# Patient Record
Sex: Female | Born: 1955 | Race: White | Hispanic: No | Marital: Married | Smoking: Current every day smoker
Health system: Southern US, Community
[De-identification: ages and names within clinical notes are randomized; demographics above are authoritative.]

## PROBLEM LIST (undated history)

## (undated) HISTORY — PX: HERNIA REPAIR: SHX51

## (undated) HISTORY — PX: ABDOMINAL HYSTERECTOMY: SHX81

---

## 2009-04-08 ENCOUNTER — Emergency Department (HOSPITAL_COMMUNITY): Admission: EM | Admit: 2009-04-08 | Discharge: 2009-04-08 | Payer: Self-pay | Admitting: Emergency Medicine

## 2015-08-05 ENCOUNTER — Emergency Department (HOSPITAL_COMMUNITY): Payer: Self-pay

## 2015-08-05 ENCOUNTER — Encounter (HOSPITAL_COMMUNITY): Payer: Self-pay | Admitting: *Deleted

## 2015-08-05 ENCOUNTER — Emergency Department (HOSPITAL_COMMUNITY)
Admission: EM | Admit: 2015-08-05 | Discharge: 2015-08-05 | Disposition: A | Discharge status: Home / Self Care | Payer: Self-pay | Attending: Emergency Medicine | Admitting: Emergency Medicine

## 2015-08-05 DIAGNOSIS — Z87828 Personal history of other (healed) physical injury and trauma: Secondary | ICD-10-CM | POA: Insufficient documentation

## 2015-08-05 DIAGNOSIS — M549 Dorsalgia, unspecified: Secondary | ICD-10-CM | POA: Insufficient documentation

## 2015-08-05 DIAGNOSIS — J449 Chronic obstructive pulmonary disease, unspecified: Secondary | ICD-10-CM

## 2015-08-05 DIAGNOSIS — J441 Chronic obstructive pulmonary disease with (acute) exacerbation: Secondary | ICD-10-CM | POA: Insufficient documentation

## 2015-08-05 DIAGNOSIS — F17200 Nicotine dependence, unspecified, uncomplicated: Secondary | ICD-10-CM | POA: Insufficient documentation

## 2015-08-05 DIAGNOSIS — Z720 Tobacco use: Secondary | ICD-10-CM

## 2015-08-05 MED ORDER — PREDNISONE 50 MG PO TABS
60.0000 mg | ORAL_TABLET | Freq: Once | ORAL | Status: AC
  Administered 2015-08-05: 60 mg via ORAL
  Filled 2015-08-05: qty 1

## 2015-08-05 MED ORDER — PREDNISONE 20 MG PO TABS: 20.0000 mg | ORAL_TABLET | Freq: Two times a day (BID) | ORAL | Status: AC

## 2015-08-05 MED ORDER — ALBUTEROL SULFATE HFA 108 (90 BASE) MCG/ACT IN AERS: 1.0000 | INHALATION_SPRAY | Freq: Four times a day (QID) | RESPIRATORY_TRACT | Status: AC | PRN

## 2015-08-05 NOTE — ED Notes (Signed)
Pt has multiple complaints; pt is c/o rib pain, sore throat, bilateral knee pain and states she has vomited x 1

## 2015-08-05 NOTE — ED Provider Notes (Signed)
CSN: [id number]     Arrival date & time 08/05/15  0439 History   First MD Initiated Contact with Patient 08/05/15 [phone]     Chief Complaint  Patient presents with  . Chest Pain      HPI  History presents for evaluation of a cough. States she's had a sore throat from coughing over the last several days. His initially states she vomited once and then states she "almost it". States she coughs until sometime she "almost vomits. Mentions as an afterthought that she fell a year ago and injured her back and her knee and "never got them looked at". Cause of most daily. 2 pack per day smoker since she was age 60.  History reviewed. No pertinent past medical history. Past Surgical History  Procedure Laterality Date  . Abdominal hysterectomy    . Hernia repair     History reviewed. No pertinent family history. Social History  Substance Use Topics  . Smoking status: Current Every Day Smoker -- 0.50 packs/day  . Smokeless tobacco: None  . Alcohol Use: No   OB History    No data available     Review of Systems  Constitutional: Negative for fever, chills, diaphoresis, appetite change and fatigue.  HENT: Negative for mouth sores, sore throat and trouble swallowing.   Eyes: Negative for visual disturbance.  Respiratory: Positive for cough, shortness of breath and wheezing. Negative for chest tightness.   Cardiovascular: Negative for chest pain.  Gastrointestinal: Negative for nausea, vomiting, abdominal pain, diarrhea and abdominal distention.  Endocrine: Negative for polydipsia, polyphagia and polyuria.  Genitourinary: Negative for dysuria, frequency and hematuria.  Musculoskeletal: Positive for back pain and arthralgias. Negative for gait problem.  Skin: Negative for color change, pallor and rash.  Neurological: Negative for dizziness, syncope, light-headedness and headaches.  Hematological: Does not bruise/bleed easily.  Psychiatric/Behavioral: Negative for behavioral problems and  confusion.      Allergies  Ibuprofen  Home Medications   Prior to Admission medications   Medication Sig Start Date End Date Taking? Authorizing Provider  albuterol (PROVENTIL HFA;VENTOLIN HFA) 108 (90 Base) MCG/ACT inhaler Inhale 1-2 puffs into the lungs every 6 (six) hours as needed for wheezing. 08/05/15   Rolland PorterMark Susano Cleckler, MD  predniSONE (DELTASONE) 20 MG tablet Take 1 tablet (20 mg total) by mouth 2 (two) times daily with a meal. 08/05/15   Rolland PorterMark Kortland Nichols, MD   BP 125/84 mmHg  Pulse 69  Temp(Src) 97.7 F (36.5 C) (Oral)  Resp 18  Ht 5\' 3"  (1.6 m)  Wt 115 lb (52.164 kg)  BMI 20.38 kg/m2  SpO2 96% Physical Exam  Constitutional: She is oriented to person, place, and time. She appears well-developed and well-nourished. No distress.  HENT:  Head: Normocephalic.  Eyes: Conjunctivae are normal. Pupils are equal, round, and reactive to light. No scleral icterus.  Neck: Normal range of motion. Neck supple. No thyromegaly present.  Cardiovascular: Normal rate and regular rhythm.  Exam reveals no gallop and no friction rub.   No murmur heard. Pulmonary/Chest: Effort normal and breath sounds normal. No respiratory distress. She has no wheezes. She has no rales.  Mild end expiratory wheeze. No increased work of breathing retractions, rhonchi, or rales. Well oxygenated.  Abdominal: Soft. Bowel sounds are normal. She exhibits no distension. There is no tenderness. There is no rebound.  Musculoskeletal: Normal range of motion.  Normal exam to left knee. No effusion. Full range of motion. Does not have an antalgic gait.  Neurological: She is alert  and oriented to person, place, and time.  Skin: Skin is warm and dry. No rash noted.  Psychiatric: She has a normal mood and affect. Her behavior is normal.    ED Course  Procedures (including critical care time) Labs Review Labs Reviewed - No data to display  Imaging Review Dg Chest 2 View  08/05/2015  CLINICAL DATA:  Acute onset of generalized chest  pain and back pain. Initial encounter. EXAM: CHEST  2 VIEW COMPARISON:  None. FINDINGS: The lungs are well-aerated. Peribronchial thickening is noted. There is no evidence of pleural effusion or pneumothorax. The heart is normal in size; the mediastinal contour is within normal limits. No acute osseous abnormalities are seen. IMPRESSION: Peribronchial thickening noted.  Lungs otherwise clear. Electronically Signed   By: Roanna Raider M.D.   On: 08/05/2015 06:12   I have personally reviewed and evaluated these images and lab results as part of my medical decision-making.   EKG Interpretation None      MDM   Final diagnoses:  Chronic obstructive pulmonary disease, unspecified COPD type (HCC)  Tobacco abuse   No acute abnormalities on chest x-ray. Likely some emphysema with her 50+-pack-year history. Not hypoxemic or febrile. Plan is treatment for exacerbation. Encouraging stop smoking.  Of note patient was rather adamant about having her knee and back x-ray because she "fell last year". States her injury was over a year ago. She has no neurological symptoms or findings. She is ambulatory without limp on her knee. Has no obvious effusion or asymmetry to her knee left versus right. No indication for acute imaging.    Rolland Porter, MD 08/05/15 [phone]

## 2015-08-05 NOTE — ED Notes (Signed)
Patient verbalizes understanding of discharge instructions, prescription medications, home care and follow up care. Patient ambulatory out of department at this time. 

## 2015-08-05 NOTE — ED Notes (Signed)
Patient refusing chest X-ray at this time, until "I get my knees and my back X-rayed" per patient. EDP notified

## 2015-08-05 NOTE — Discharge Instructions (Signed)
Stop smoking. Stop smoking. Stop smoking.  How to Use an Inhaler Using your inhaler correctly is very important. Good technique will make sure that the medicine reaches your lungs.  HOW TO USE AN INHALER:  Take the cap off the inhaler.  If this is the first time using your inhaler, you need to prime it. Shake the inhaler for 5 seconds. Release four puffs into the air, away from your face. Ask your doctor for help if you have questions.  Shake the inhaler for 5 seconds.  Turn the inhaler so the bottle is above the mouthpiece.  Put your pointer finger on top of the bottle. Your thumb holds the bottom of the inhaler.  Open your mouth.  Either hold the inhaler away from your mouth (the width of 2 fingers) or place your lips tightly around the mouthpiece. Ask your doctor which way to use your inhaler.  Breathe out as much air as possible.  Breathe in and push down on the bottle 1 time to release the medicine. You will feel the medicine go in your mouth and throat.  Continue to take a deep breath in very slowly. Try to fill your lungs.  After you have breathed in completely, hold your breath for 10 seconds. This will help the medicine to settle in your lungs. If you cannot hold your breath for 10 seconds, hold it for as long as you can before you breathe out.  Breathe out slowly, through pursed lips. Whistling is an example of pursed lips.  If your doctor has told you to take more than 1 puff, wait at least 15-30 seconds between puffs. This will help you get the best results from your medicine. Do not use the inhaler more than your doctor tells you to.  Put the cap back on the inhaler.  Follow the directions from your doctor or from the inhaler package about cleaning the inhaler. If you use more than one inhaler, ask your doctor which inhalers to use and what order to use them in. Ask your doctor to help you figure out when you will need to refill your inhaler.  If you use a steroid  inhaler, always rinse your mouth with water after your last puff, gargle and spit out the water. Do not swallow the water. GET HELP IF:  The inhaler medicine only partially helps to stop wheezing or shortness of breath.  You are having trouble using your inhaler.  You have some increase in thick spit (phlegm). GET HELP RIGHT AWAY IF:  The inhaler medicine does not help your wheezing or shortness of breath or you have tightness in your chest.  You have dizziness, headaches, or fast heart rate.  You have chills, fever, or night sweats.  You have a large increase of thick spit, or your thick spit is bloody. MAKE SURE YOU:   Understand these instructions.  Will watch your condition.  Will get help right away if you are not doing well or get worse.   This information is not intended to replace advice given to you by your health care provider. Make sure you discuss any questions you have with your health care provider.   Document Released: 04/27/2008 Document Revised: 05/09/2013 Document Reviewed: 02/15/2013 Elsevier Interactive Patient Education 2016 [employer]  Chronic Obstructive Pulmonary Disease Exacerbation Chronic obstructive pulmonary disease (COPD) is a common lung problem. In COPD, the flow of air from the lungs is limited. COPD exacerbations are times that breathing gets worse and you need  extra treatment. Without treatment they can be life threatening. If they happen often, your lungs can become more damaged. If your COPD gets worse, your doctor may treat you with:  Medicines.  Oxygen.  Different ways to clear your airway, such as using a mask. HOME CARE  Do not smoke.  Avoid tobacco smoke and other things that bother your lungs.  If given, take your antibiotic medicine as told. Finish the medicine even if you start to feel better.  Only take medicines as told by your doctor.  Drink enough fluids to keep your pee (urine) clear or pale yellow (unless your  doctor has told you not to).  Use a cool mist machine (vaporizer).  If you use oxygen or a machine that turns liquid medicine into a mist (nebulizer), continue to use them as told.  Keep up with shots (vaccinations) as told by your doctor.  Exercise regularly.  Eat healthy foods.  Keep all doctor visits as told. GET HELP RIGHT AWAY IF:  You are very short of breath and it gets worse.  You have trouble talking.  You have bad chest pain.  You have blood in your spit (sputum).  You have a fever.  You keep throwing up (vomiting).  You feel weak, or you pass out (faint).  You feel confused.  You keep getting worse. MAKE SURE YOU:  Understand these instructions.  Will watch your condition.  Will get help right away if you are not doing well or get worse.   This information is not intended to replace advice given to you by your health care provider. Make sure you discuss any questions you have with your health care provider.   Document Released: 07/08/2011 Document Revised: 08/09/2014 Document Reviewed: 03/23/2013 Elsevier Interactive Patient Education 2016 [employer].  Smoking Cessation, Tips for Success If you are ready to quit smoking, congratulations! You have chosen to help yourself be healthier. Cigarettes bring nicotine, tar, carbon monoxide, and other irritants into your body. Your lungs, heart, and blood vessels will be able to work better without these poisons. There are many different ways to quit smoking. Nicotine gum, nicotine patches, a nicotine inhaler, or nicotine nasal spray can help with physical craving. Hypnosis, support groups, and medicines help break the habit of smoking. WHAT THINGS CAN I DO TO MAKE QUITTING EASIER?  Here are some tips to help you quit for good:  Pick a date when you will quit smoking completely. Tell all of your friends and family about your plan to quit on that date.  Do not try to slowly cut down on the number of cigarettes  you are smoking. Pick a quit date and quit smoking completely starting on that day.  Throw away all cigarettes.   Clean and remove all ashtrays from your home, work, and car.  On a card, write down your reasons for quitting. Carry the card with you and read it when you get the urge to smoke.  Cleanse your body of nicotine. Drink enough water and fluids to keep your urine clear or pale yellow. Do this after quitting to flush the nicotine from your body.  Learn to predict your moods. Do not let a bad situation be your excuse to have a cigarette. Some situations in your life might tempt you into wanting a cigarette.  Never have "just one" cigarette. It leads to wanting another and another. Remind yourself of your decision to quit.  Change habits associated with smoking. If you smoked while driving  or when feeling stressed, try other activities to replace smoking. Stand up when drinking your coffee. Brush your teeth after eating. Sit in a different chair when you read the paper. Avoid alcohol while trying to quit, and try to drink fewer caffeinated beverages. Alcohol and caffeine may urge you to smoke.  Avoid foods and drinks that can trigger a desire to smoke, such as sugary or spicy foods and alcohol.  Ask people who smoke not to smoke around you.  Have something planned to do right after eating or having a cup of coffee. For example, plan to take a walk or exercise.  Try a relaxation exercise to calm you down and decrease your stress. Remember, you may be tense and nervous for the first 2 weeks after you quit, but this will pass.  Find new activities to keep your hands busy. Play with a pen, coin, or rubber band. Doodle or draw things on paper.  Brush your teeth right after eating. This will help cut down on the craving for the taste of tobacco after meals. You can also try mouthwash.   Use oral substitutes in place of cigarettes. Try using lemon drops, carrots, cinnamon sticks, or  chewing gum. Keep them handy so they are available when you have the urge to smoke.  When you have the urge to smoke, try deep breathing.  Designate your home as a nonsmoking area.  If you are a heavy smoker, ask your health care provider about a prescription for nicotine chewing gum. It can ease your withdrawal from nicotine.  Reward yourself. Set aside the cigarette money you save and buy yourself something nice.  Look for support from others. Join a support group or smoking cessation program. Ask someone at home or at work to help you with your plan to quit smoking.  Always ask yourself, "Do I need this cigarette or is this just a reflex?" Tell yourself, "Today, I choose not to smoke," or "I do not want to smoke." You are reminding yourself of your decision to quit.  Do not replace cigarette smoking with electronic cigarettes (commonly called e-cigarettes). The safety of e-cigarettes is unknown, and some may contain harmful chemicals.  If you relapse, do not give up! Plan ahead and think about what you will do the next time you get the urge to smoke. HOW WILL I FEEL WHEN I QUIT SMOKING? You may have symptoms of withdrawal because your body is used to nicotine (the addictive substance in cigarettes). You may crave cigarettes, be irritable, feel very hungry, cough often, get headaches, or have difficulty concentrating. The withdrawal symptoms are only temporary. They are strongest when you first quit but will go away within 10-14 days. When withdrawal symptoms occur, stay in control. Think about your reasons for quitting. Remind yourself that these are signs that your body is healing and getting used to being without cigarettes. Remember that withdrawal symptoms are easier to treat than the major diseases that smoking can cause.  Even after the withdrawal is over, expect periodic urges to smoke. However, these cravings are generally short lived and will go away whether you smoke or not. Do not  smoke! WHAT RESOURCES ARE AVAILABLE TO HELP ME QUIT SMOKING? Your health care provider can direct you to community resources or hospitals for support, which may include:  Group support.  Education.  Hypnosis.  Therapy.   This information is not intended to replace advice given to you by your health care provider. Make sure you discuss  any questions you have with your health care provider.   Document Released: 04/16/2004 Document Revised: 08/09/2014 Document Reviewed: 01/04/2013 Elsevier Interactive Patient Education [employer] Inc[employer]

## 2016-10-15 IMAGING — DX DG CHEST 2V
2 series · 2 of 2 positions shown · non-contrast
Comparison: None.

CLINICAL DATA: Acute onset of generalized chest pain and back pain.
Initial encounter.

EXAM:
CHEST  2 VIEW

[chest pa]
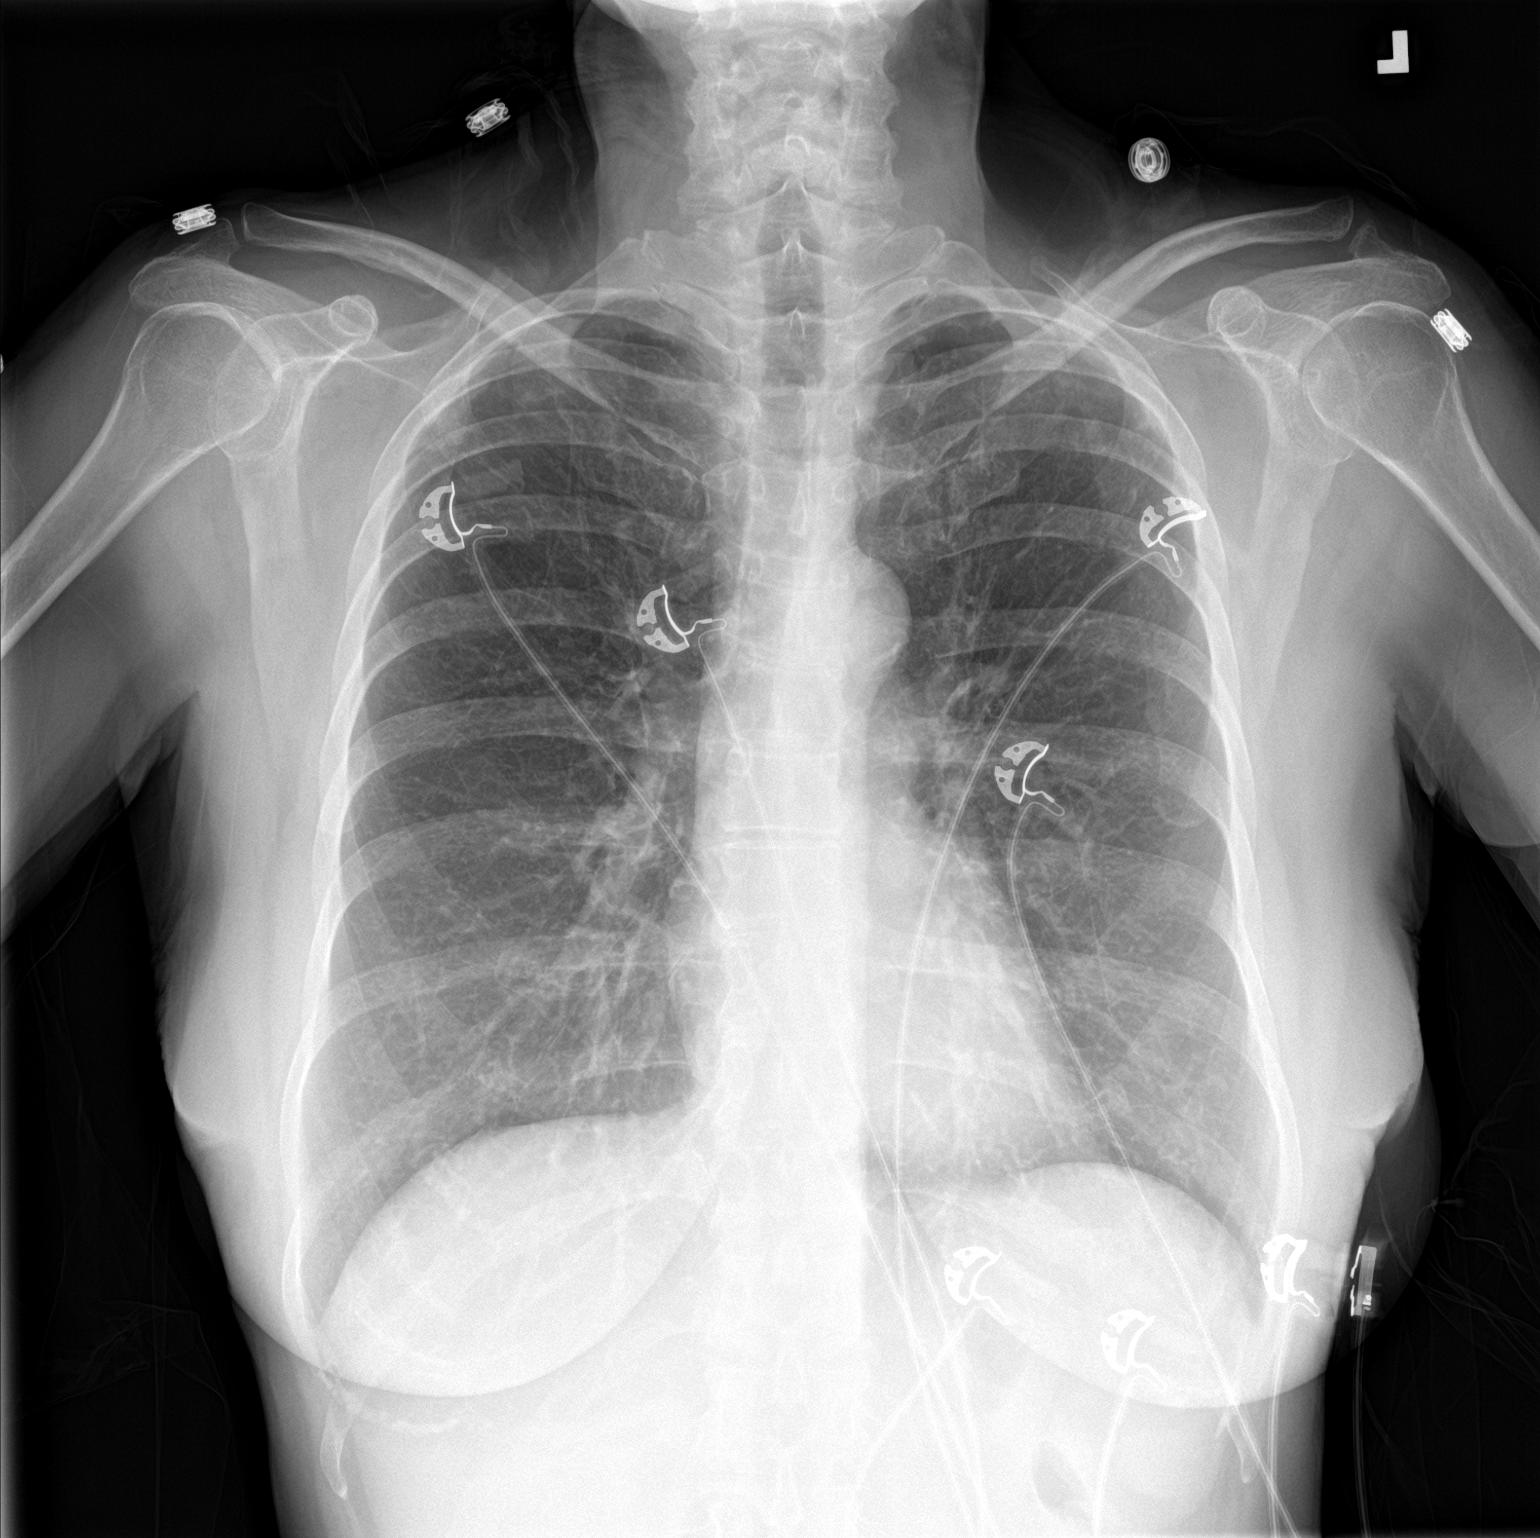

[chest lat]
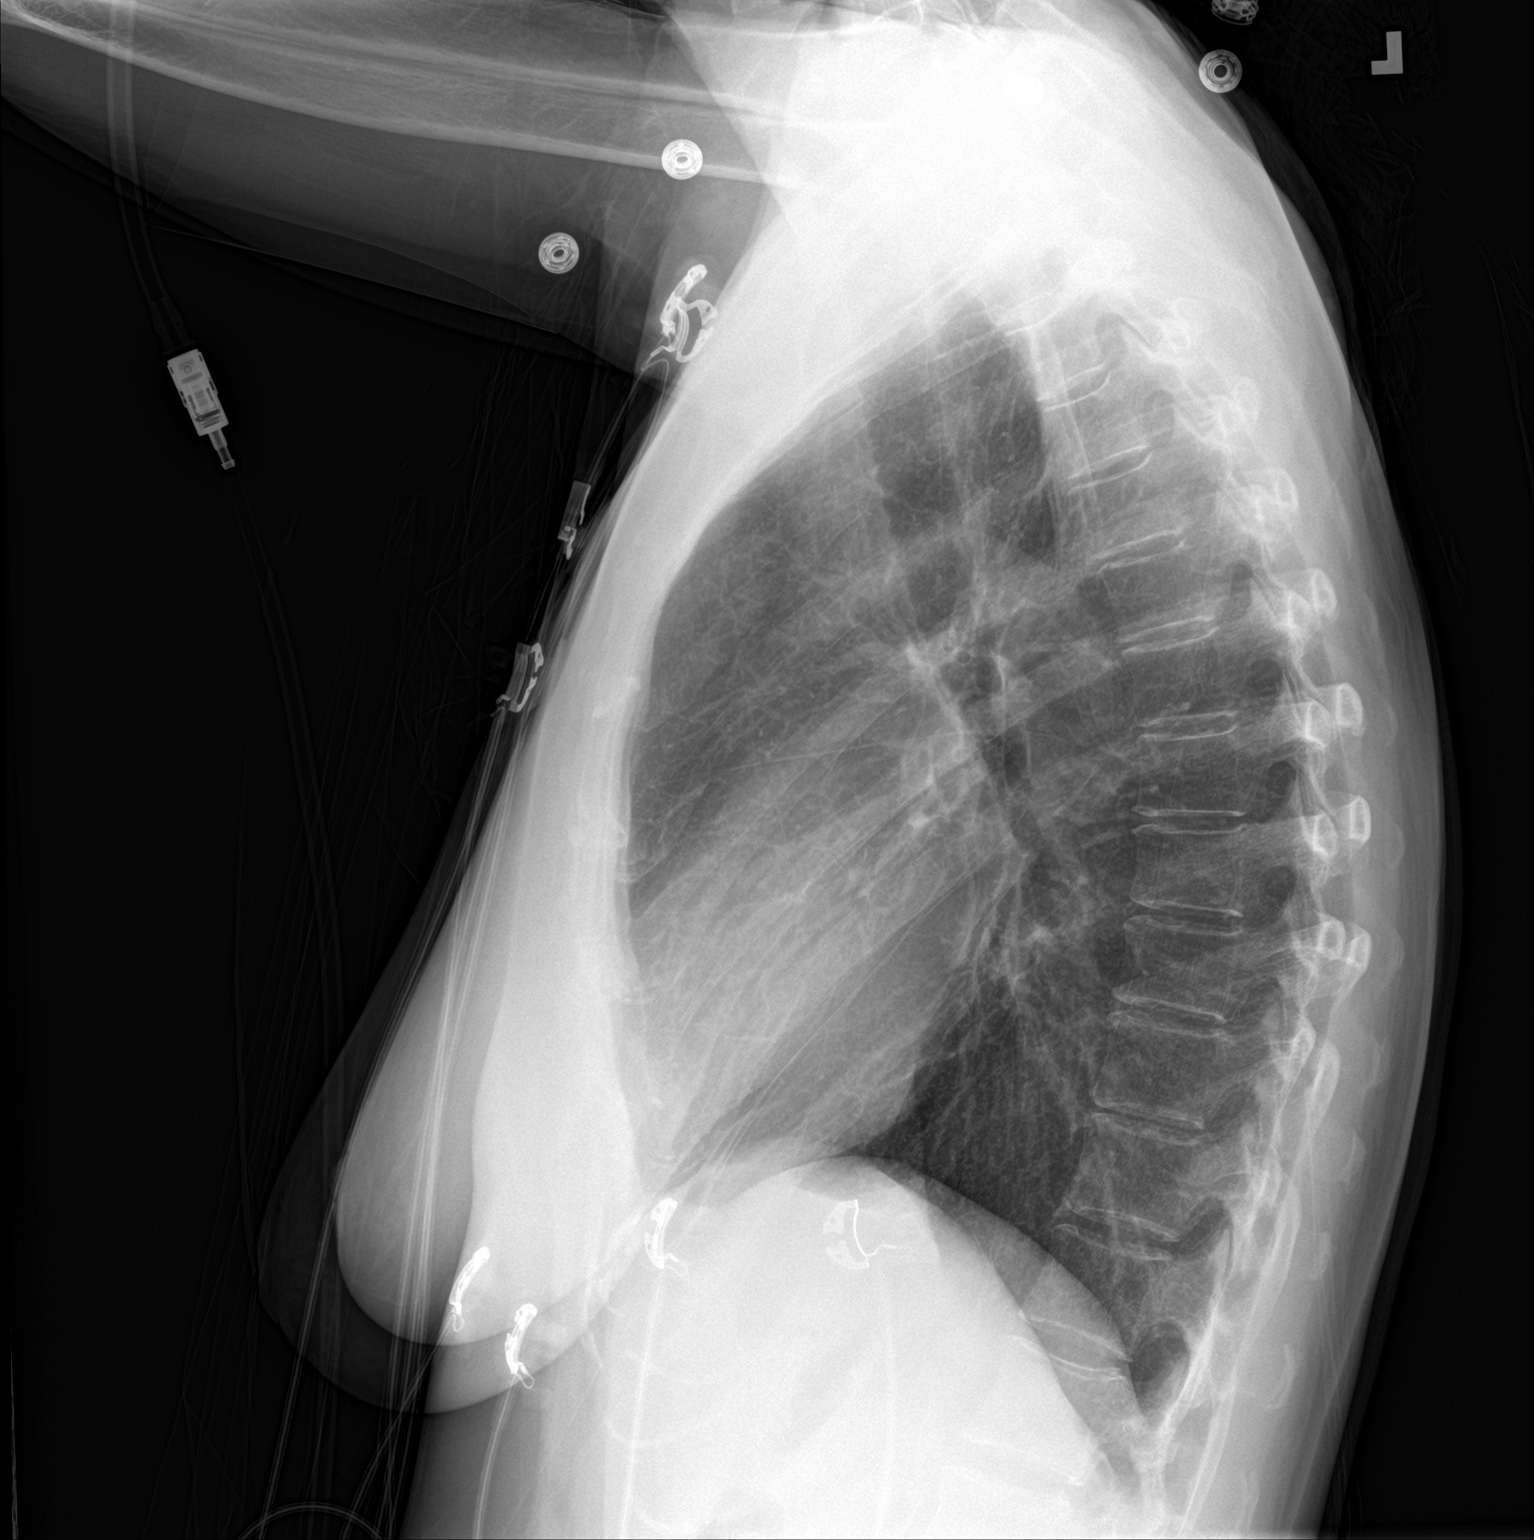

[2 of 2 positions shown; findings below may reference images not displayed]

FINDINGS: The lungs are well-aerated. Peribronchial thickening is noted. There
is no evidence of pleural effusion or pneumothorax.

The heart is normal in size; the mediastinal contour is within
normal limits. No acute osseous abnormalities are seen.
IMPRESSION: Peribronchial thickening noted.  Lungs otherwise clear.
